# Patient Record
Sex: Male | Born: 1975 | Race: White | Hispanic: No | Marital: Single | Smoking: Former smoker
Health system: Southern US, Community
[De-identification: ages and names within clinical notes are randomized; demographics above are authoritative.]

## PROBLEM LIST (undated history)

## (undated) DIAGNOSIS — J45909 Unspecified asthma, uncomplicated: Secondary | ICD-10-CM

## (undated) DIAGNOSIS — F419 Anxiety disorder, unspecified: Secondary | ICD-10-CM

## (undated) HISTORY — PX: INGUINAL HERNIA REPAIR: SUR1180

## (undated) HISTORY — DX: Anxiety disorder, unspecified: F41.9

## (undated) HISTORY — PX: BACK SURGERY: SHX140

## (undated) HISTORY — DX: Unspecified asthma, uncomplicated: J45.909

---

## 2006-09-18 ENCOUNTER — Encounter: Payer: Self-pay | Admitting: Internal Medicine

## 2006-10-05 ENCOUNTER — Encounter: Payer: Self-pay | Admitting: Internal Medicine

## 2007-03-07 ENCOUNTER — Emergency Department: Payer: Self-pay | Admitting: Emergency Medicine

## 2007-08-30 ENCOUNTER — Ambulatory Visit: Payer: Self-pay | Admitting: Urology

## 2007-11-19 ENCOUNTER — Ambulatory Visit: Payer: Self-pay | Admitting: Internal Medicine

## 2007-12-05 ENCOUNTER — Ambulatory Visit: Payer: Self-pay | Admitting: Family Medicine

## 2007-12-09 ENCOUNTER — Ambulatory Visit: Payer: Self-pay | Admitting: Orthopedic Surgery

## 2007-12-15 ENCOUNTER — Ambulatory Visit: Payer: Self-pay | Admitting: Pain Medicine

## 2007-12-22 ENCOUNTER — Encounter: Payer: Self-pay | Admitting: Physician Assistant

## 2007-12-27 ENCOUNTER — Ambulatory Visit: Payer: Self-pay | Admitting: Physician Assistant

## 2007-12-28 ENCOUNTER — Emergency Department: Payer: Self-pay | Admitting: Internal Medicine

## 2008-01-04 ENCOUNTER — Encounter: Payer: Self-pay | Admitting: Physician Assistant

## 2008-02-21 ENCOUNTER — Ambulatory Visit: Payer: Self-pay | Admitting: Pain Medicine

## 2008-02-29 ENCOUNTER — Ambulatory Visit: Payer: Self-pay | Admitting: Pain Medicine

## 2008-03-16 ENCOUNTER — Ambulatory Visit: Payer: Self-pay | Admitting: Physician Assistant

## 2008-05-31 ENCOUNTER — Ambulatory Visit: Payer: Self-pay | Admitting: Pain Medicine

## 2008-10-18 ENCOUNTER — Ambulatory Visit: Payer: Self-pay | Admitting: General Practice

## 2008-10-31 ENCOUNTER — Ambulatory Visit: Payer: Self-pay | Admitting: Urology

## 2008-11-07 ENCOUNTER — Ambulatory Visit: Payer: Self-pay | Admitting: Urology

## 2008-12-08 ENCOUNTER — Ambulatory Visit: Payer: Self-pay | Admitting: General Practice

## 2009-03-20 ENCOUNTER — Ambulatory Visit: Payer: Self-pay

## 2009-05-05 ENCOUNTER — Emergency Department: Payer: Self-pay | Admitting: Emergency Medicine

## 2009-06-14 ENCOUNTER — Ambulatory Visit: Payer: Self-pay | Admitting: Unknown Physician Specialty

## 2009-07-03 ENCOUNTER — Ambulatory Visit: Payer: Self-pay | Admitting: Unknown Physician Specialty

## 2009-07-12 ENCOUNTER — Ambulatory Visit: Payer: Self-pay

## 2009-09-05 ENCOUNTER — Ambulatory Visit: Payer: Self-pay

## 2011-01-09 IMAGING — CT CT STONE STUDY
1 of 2 series · 15 of 32 positions shown, 19 images · non-contrast
Comparison: none

REASON FOR EXAM: call report [id number] for Tallison Spielmann pain
hematuria increased flank pain
COMMENTS:

[Series 2: stone · axial · 0.75mm/px · z∈[-528,-110]mm · 15 of 153 slices shown, 19 images]
[im 7/153  soft-tissue]
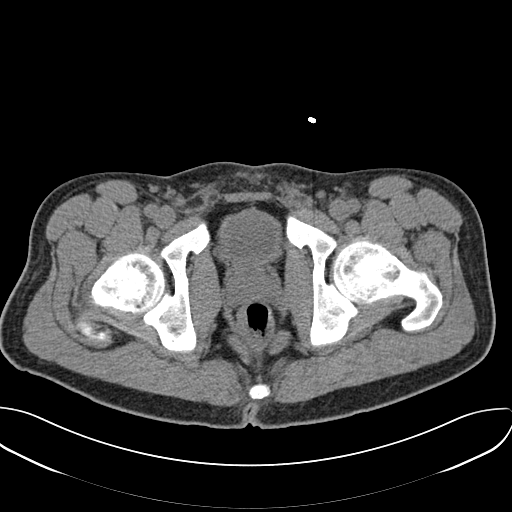
[im 7/153  bone]
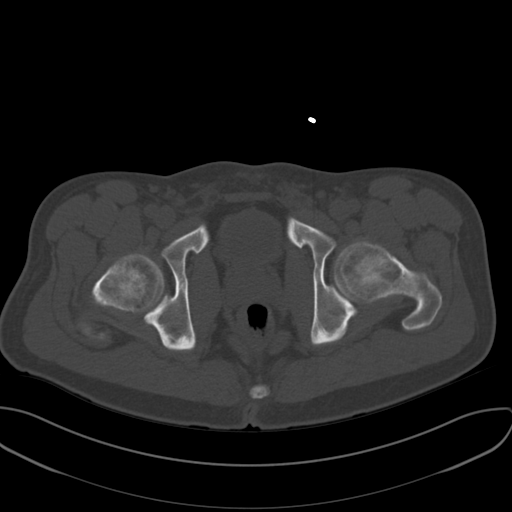
[im 19/153  soft-tissue]
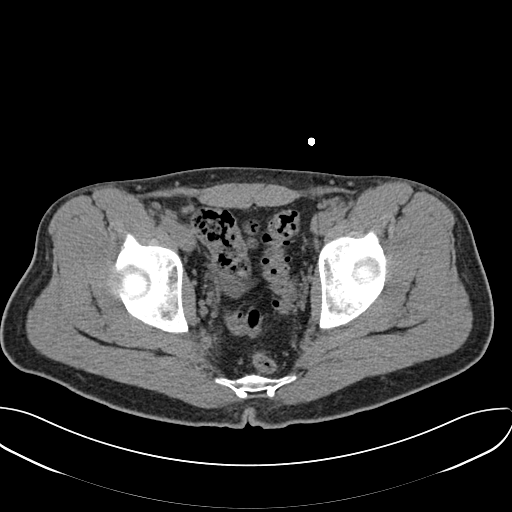
[im 31/153  soft-tissue]
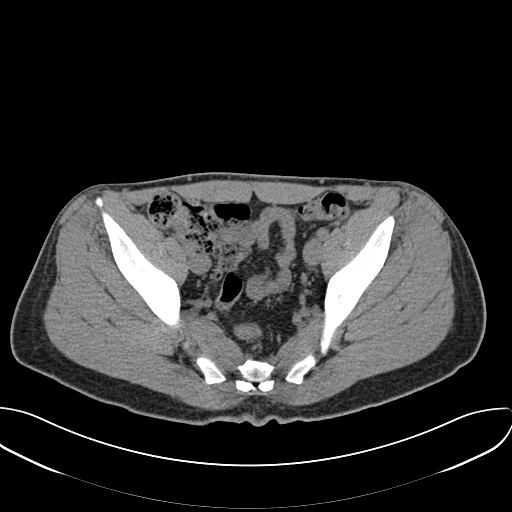
[im 43/153  soft-tissue]
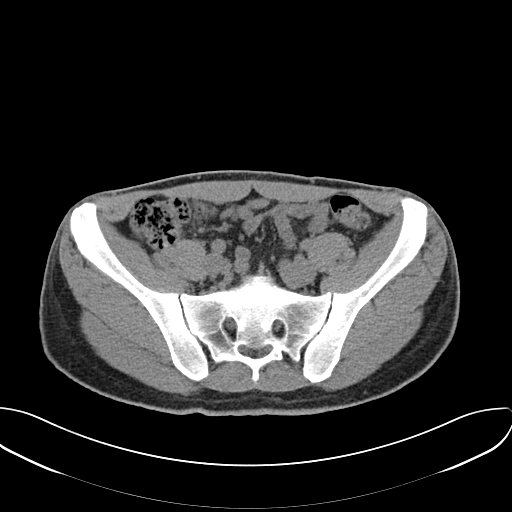
[im 55/153  soft-tissue]
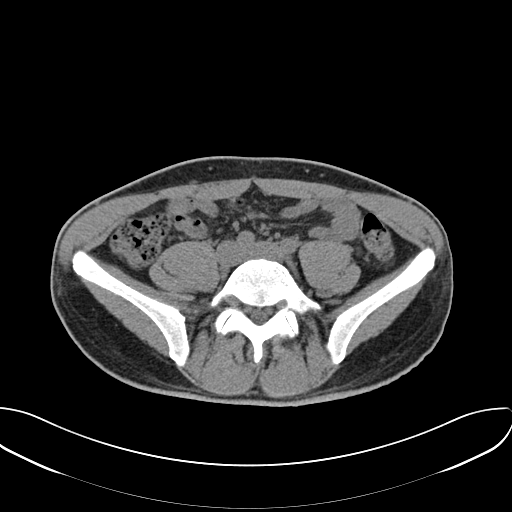
[im 67/153  soft-tissue]
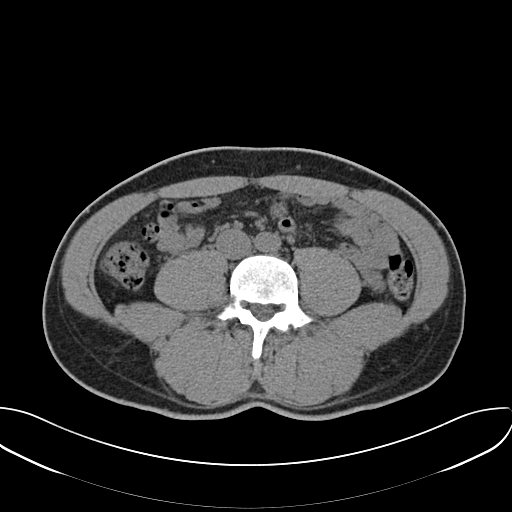
[im 80/153  soft-tissue]
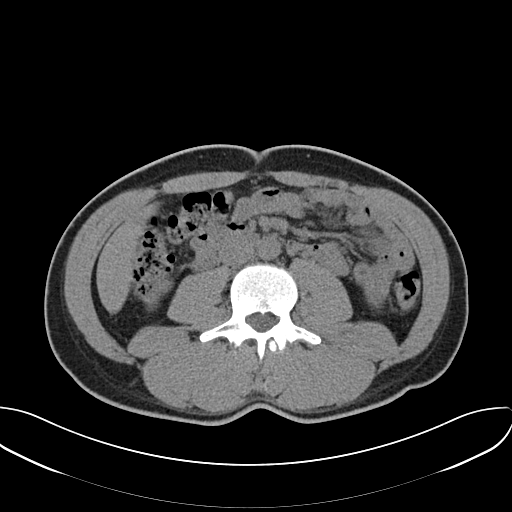
[im 86/153  soft-tissue]
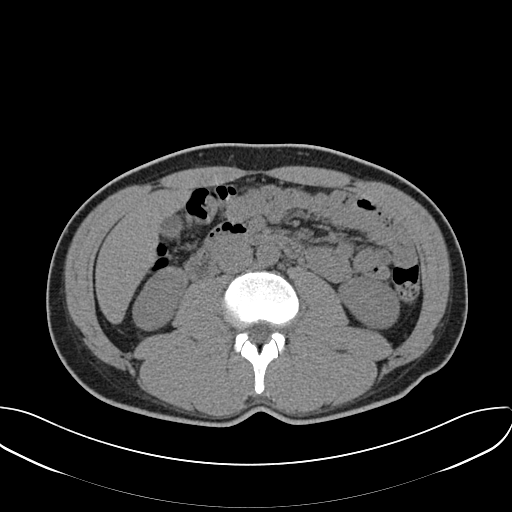
[im 98/153  soft-tissue]
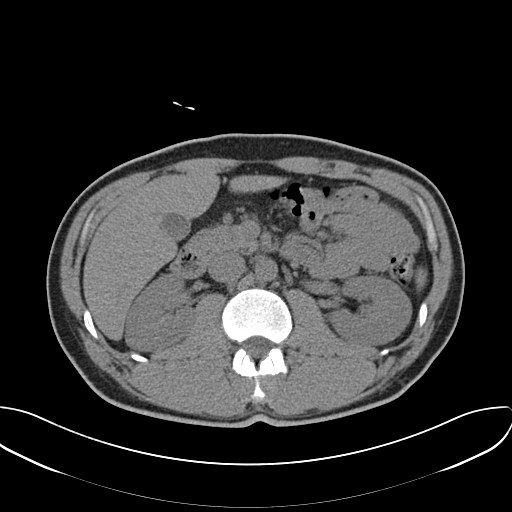
[im 98/153  bone]
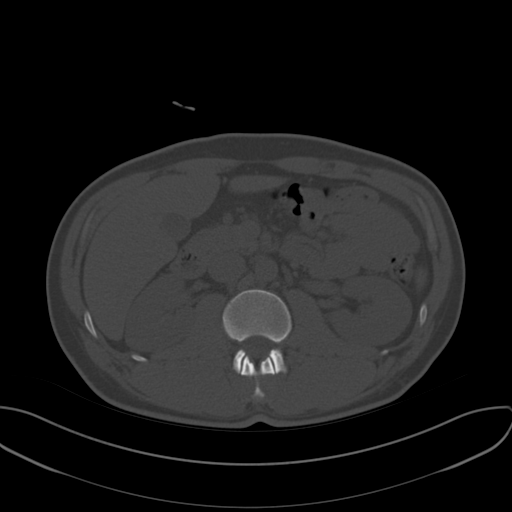
[im 110/153  soft-tissue]
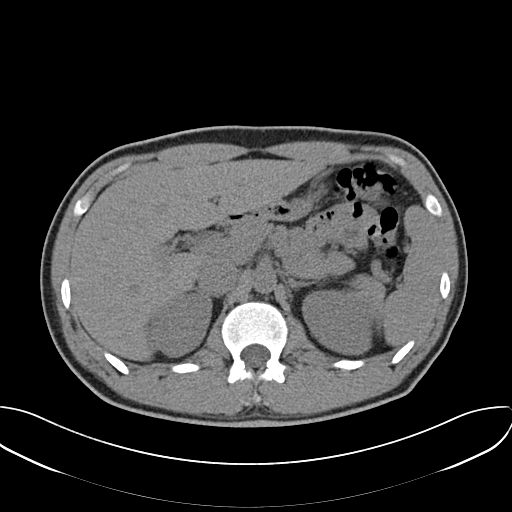
[im 122/153  soft-tissue]
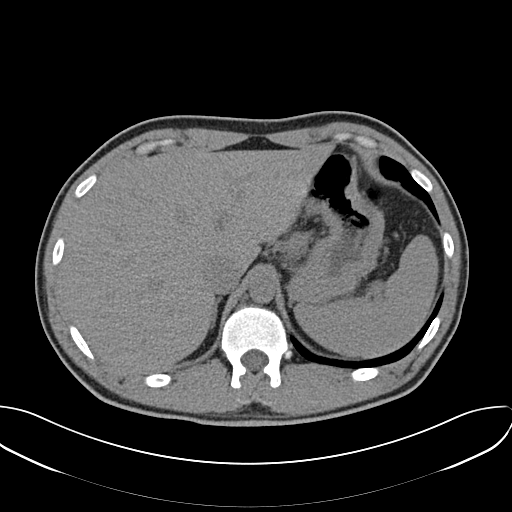
[im 128/153  lung]
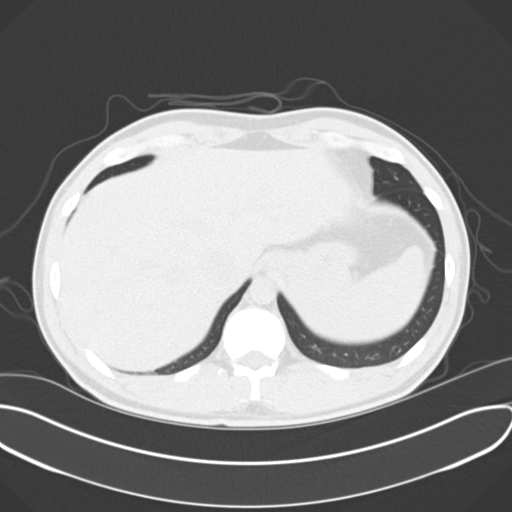
[im 134/153  soft-tissue]
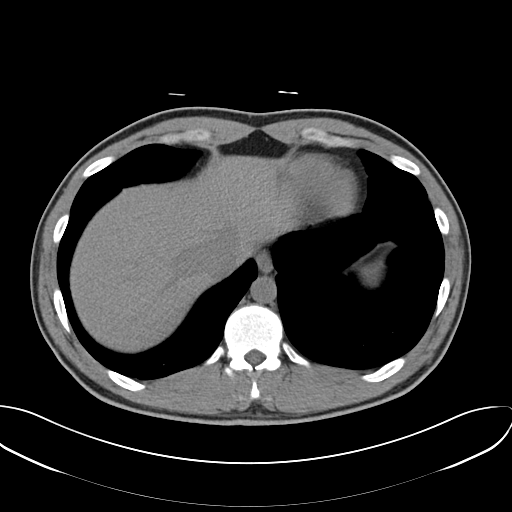
[im 134/153  lung]
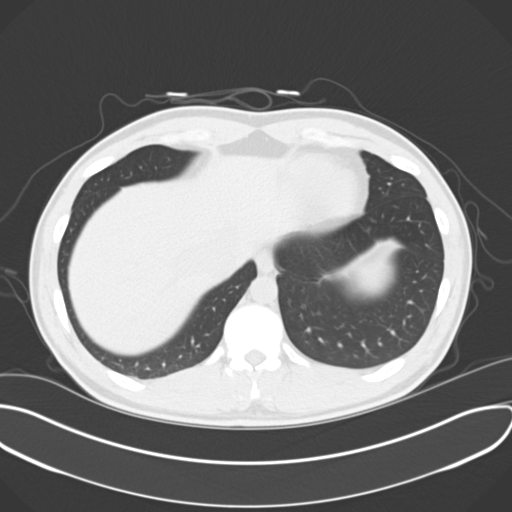
[im 140/153  lung]
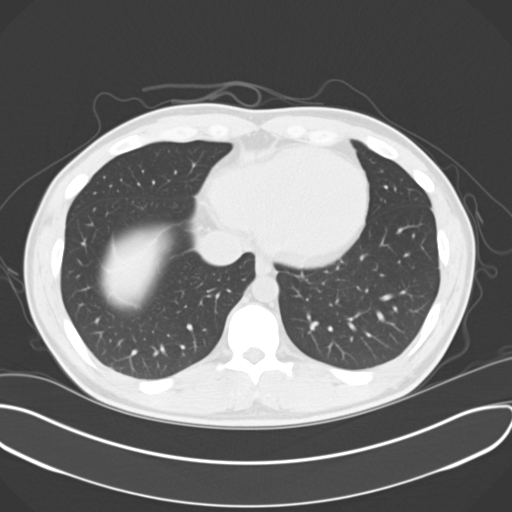
[im 146/153  soft-tissue]
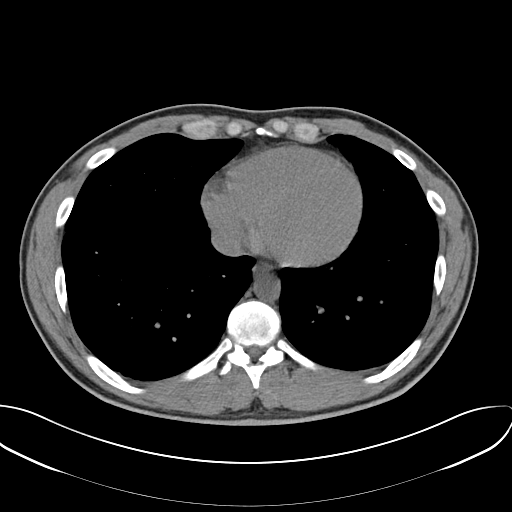
[im 146/153  lung]
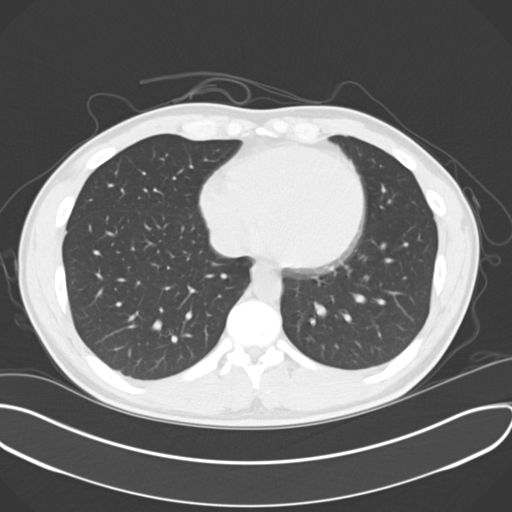

[15 of 32 positions shown; findings below may reference images not displayed]

PROCEDURE:     CT  - CT ABDOMEN /PELVIS WO (STONE)  - October 18, 2008  [DATE]

RESULT:     Axial noncontrast CT scanning was performed through the abdomen
and pelvis at 3 mm intervals and slice thicknesses. Review of 3-dimensional
reconstructed images was performed separately on the [employer] monitor.

The kidneys exhibit normal contour and density. Submillimeter calcification
is seen in the medullary portion of both kidneys. In the lower pole of the
left kidney there is nonobstructive 2 mm diameter stone. There may be a
submillimeter stone in a mid pole calyx on the right. The perinephric fat
exhibits normal density. The partially distended urinary bladder is normal
in appearance. I see no evidence of abnormal calcifications along the
expected course of the ureters.

The liver, spleen, gallbladder, adrenal glands, nondistended stomach, and
pancreas exhibit no acute abnormality. The unopacified loops of small and
large bowel are normal in appearance. There is no free fluid in the pelvis.
The partially distended urinary bladder is normal in appearance. There is a
structure demonstrated consistent with a normal appendix.
IMPRESSION: 1. There are calcifications within the renal collecting systems. Most
measure less than 1 mm in diameter but a 2 mm diameter lower pole stone on
the left is present. I see no hydronephrosis.
2. I do not see acute abnormality elsewhere within the abdomen or pelvis.

This report was called to the Angi Billiot, PA at the conclusion of the
study.

## 2011-04-07 IMAGING — CR DG ABDOMEN 1V
1 series · 3 of 3 positions shown · non-contrast
Comparison: none

REASON FOR EXAM: kidney stone   send films with pt
COMMENTS:

[Series 1: view not recorded · 0.17mm/px · 3 of 3 slices shown]
[im 1/3]
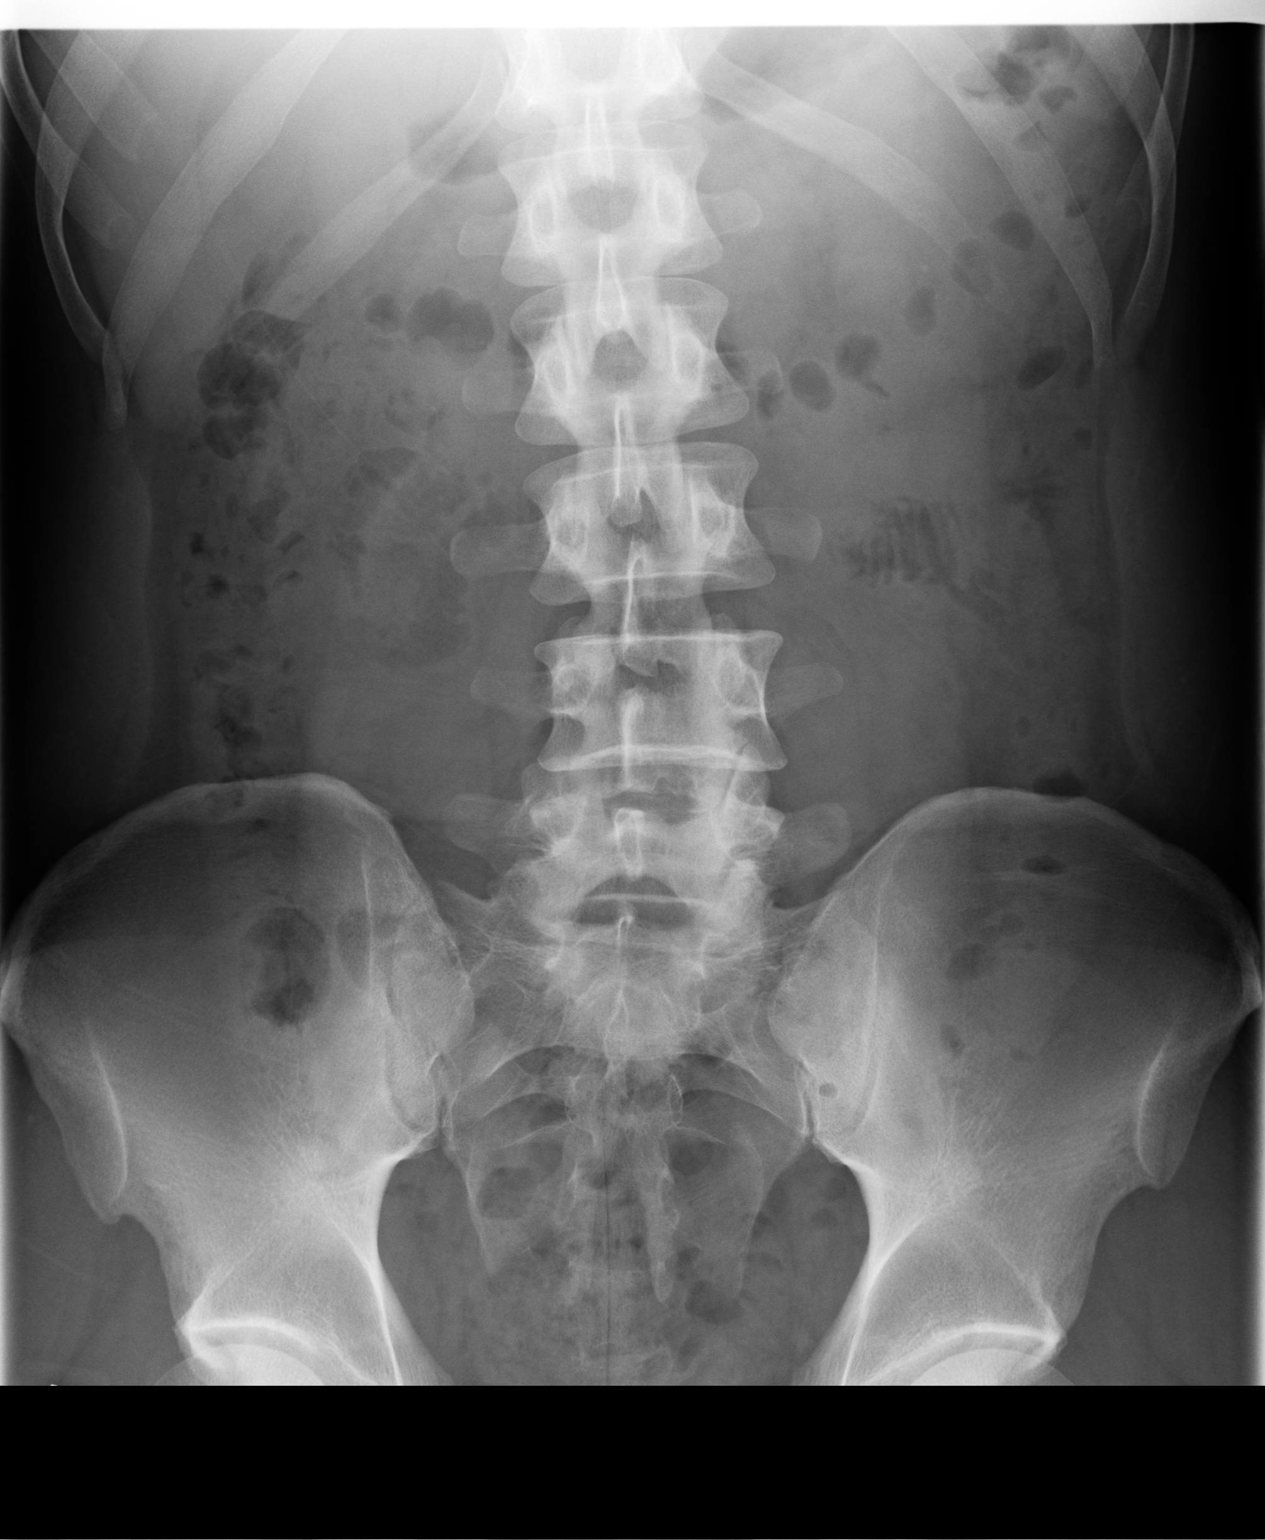
[im 2/3]
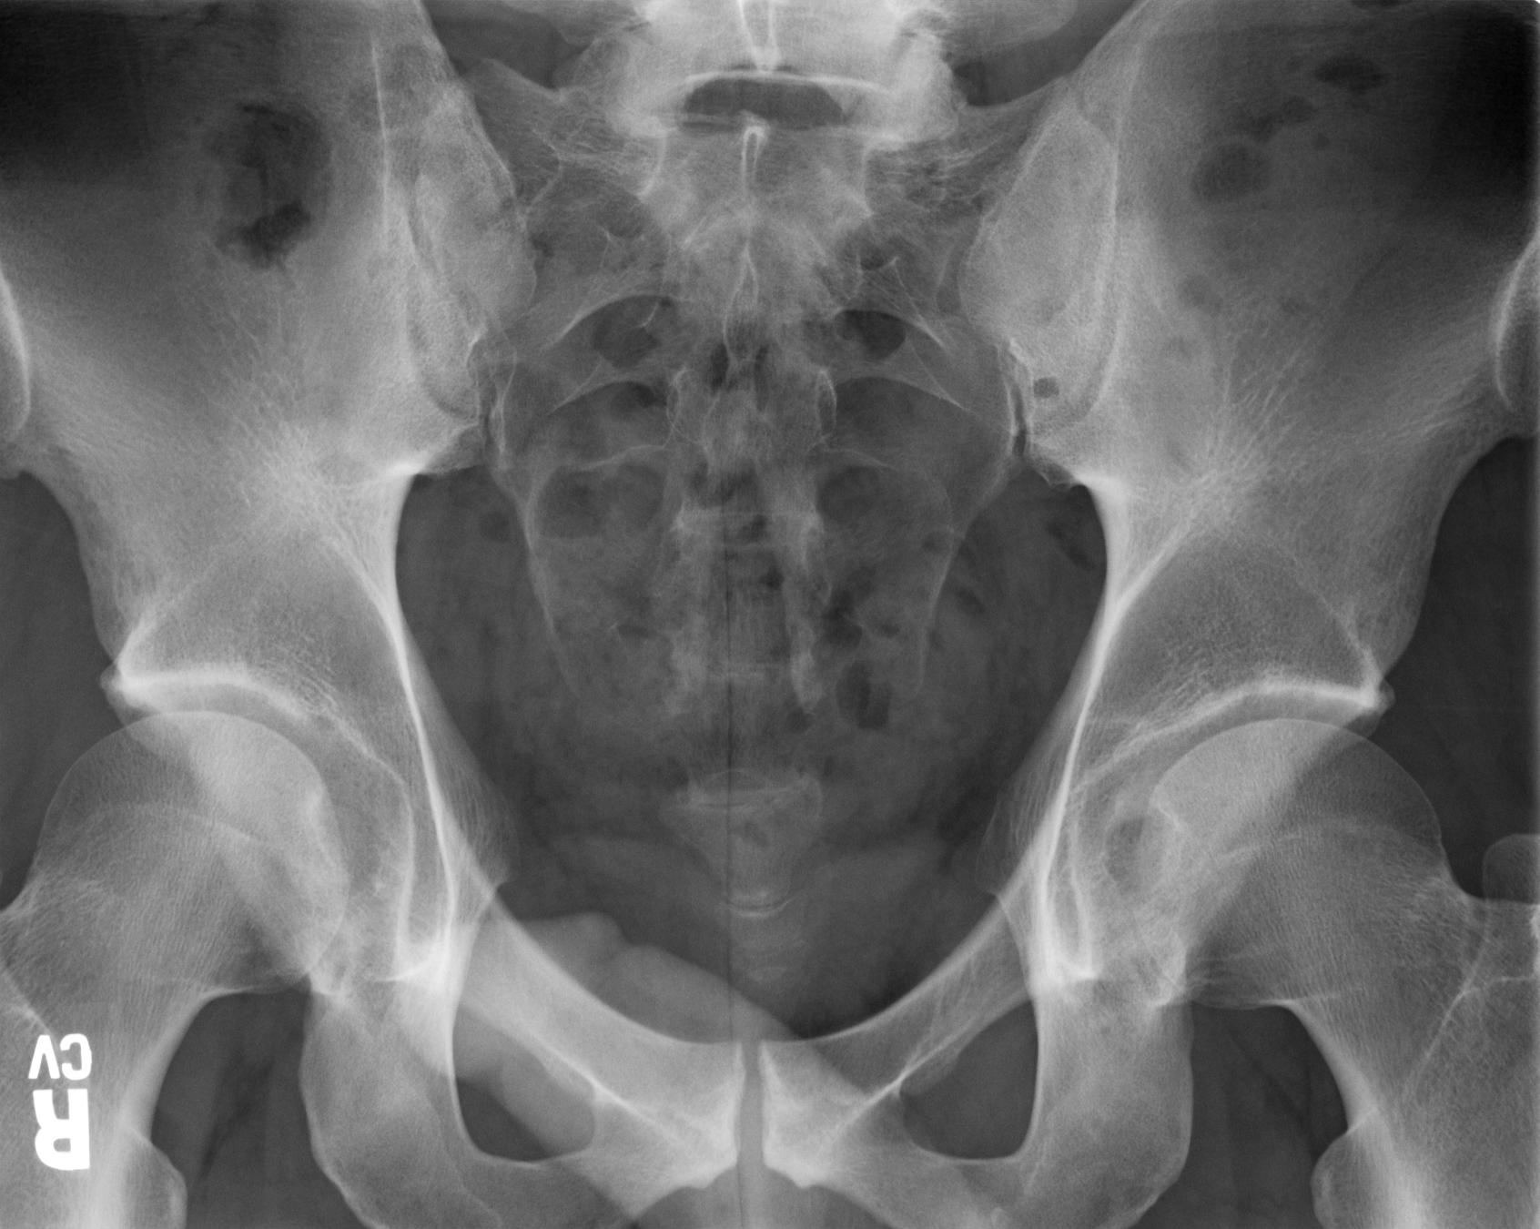
[im 3/3]
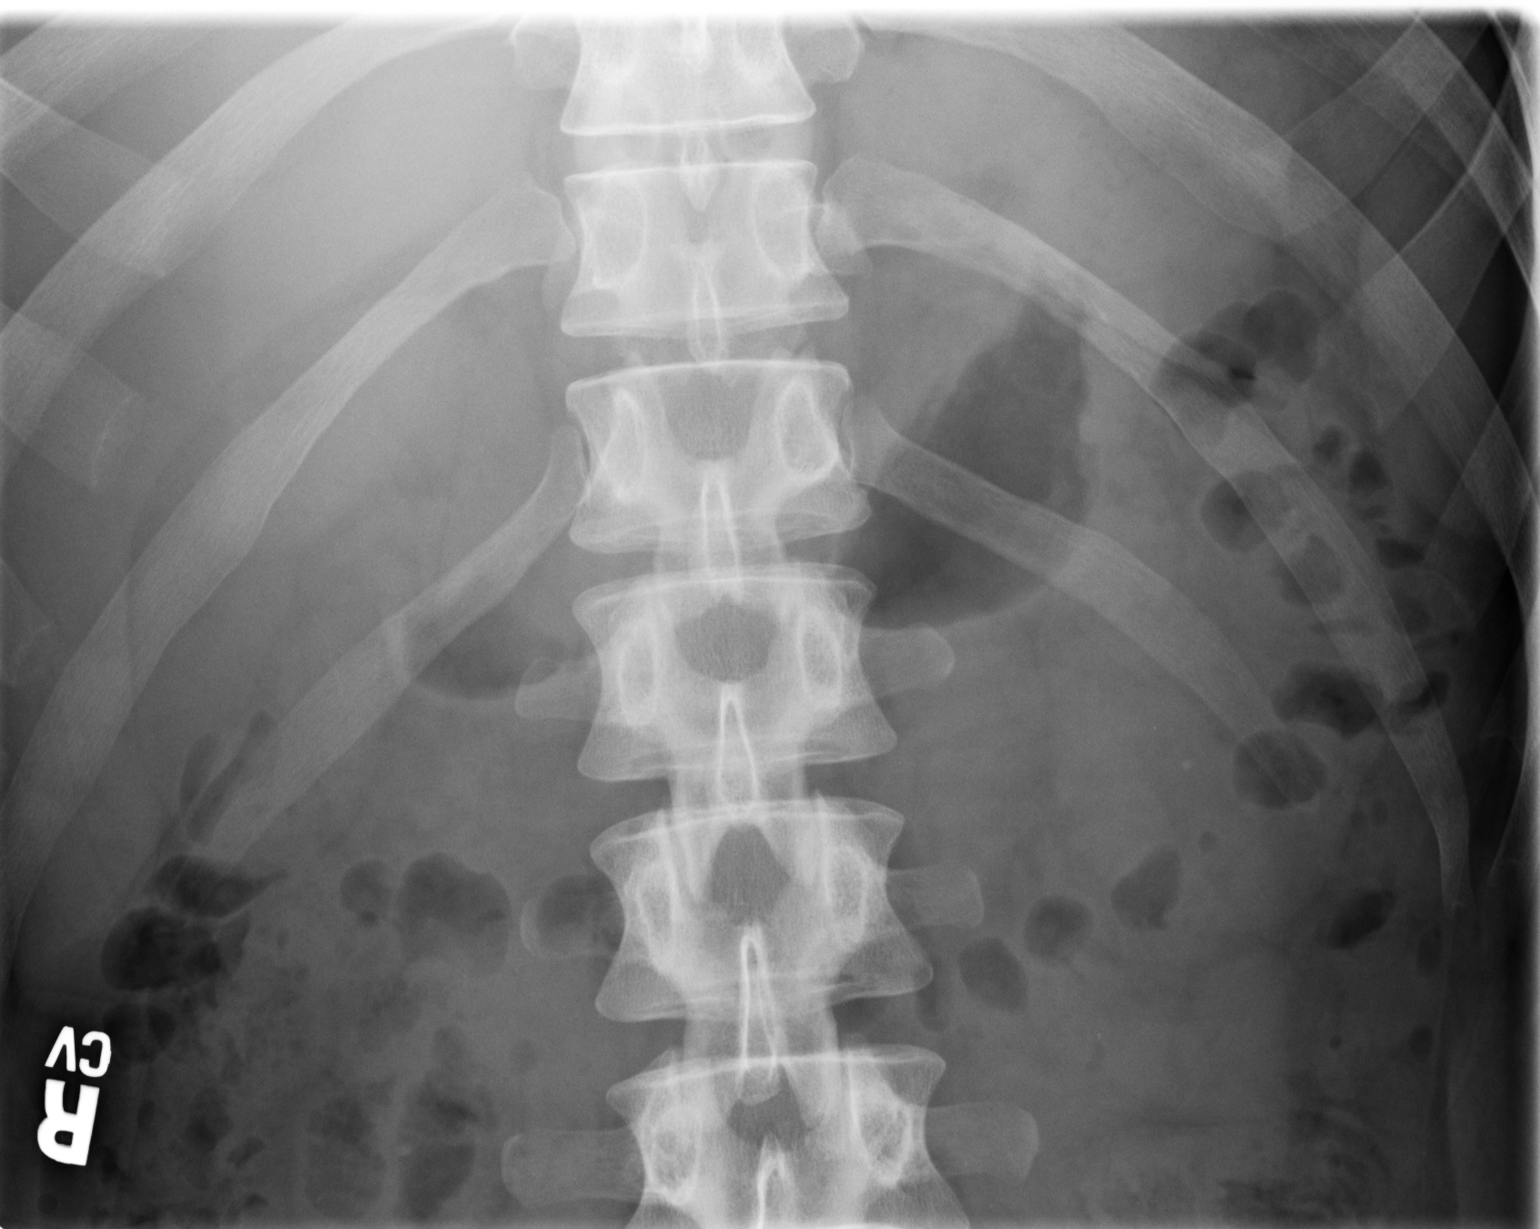

[3 of 3 positions shown; findings below may reference images not displayed]

PROCEDURE:     MDR - MDR KIDNEY URETER BLADDER  - November 07, 2008  [DATE]

RESULT:     Comparison is made to study 31 October, 2008.

The bowel gas pattern is within the limits of normal. I do not see definite
abnormal calcifications projecting over the right kidney. A faint calcific
density on the left may be associated with the left kidney. It lies just
inferior to the twelfth rib. The bony structures appear normal.
IMPRESSION: A tiny calcification may be associated with the lower pole
of the left kidney. Otherwise I do not see findings suspicious for calcified
stones.

## 2011-05-08 IMAGING — US US PELVIS LIMITED
1 series · 17 of 25 positions shown · non-contrast
Comparison: none

REASON FOR EXAM: R inguinal pain/hernia  scrotal swelling
COMMENTS:

[Series 1: us pelvis limited · 17 of 68 slices shown]
[im 1/68]
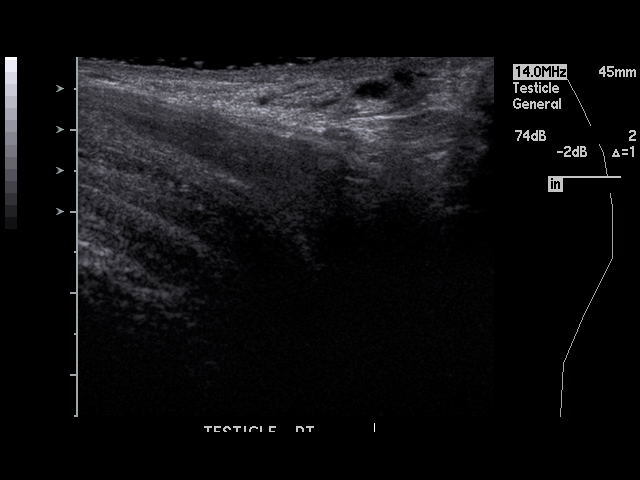
[im 6/68]
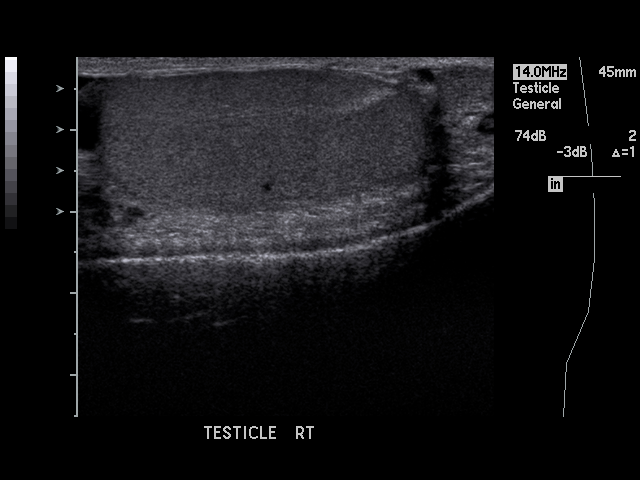
[im 9/68]
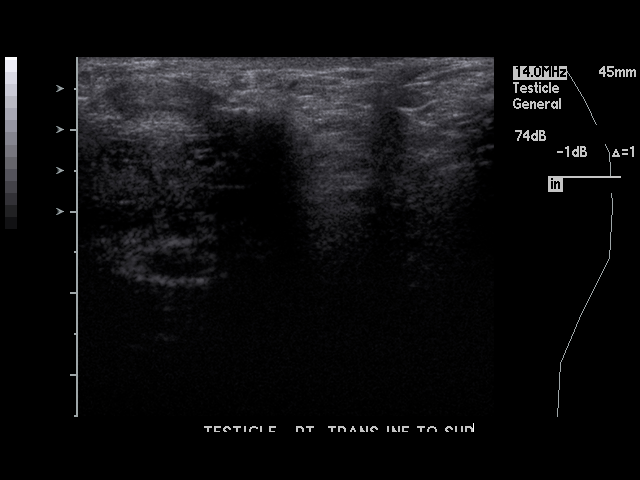
[im 14/68]
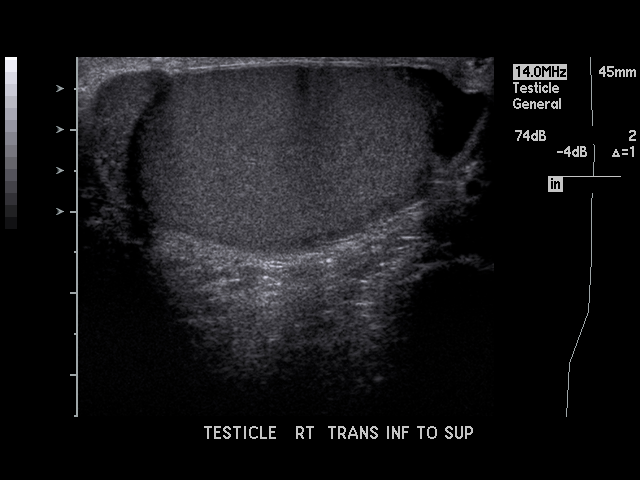
[im 17/68]
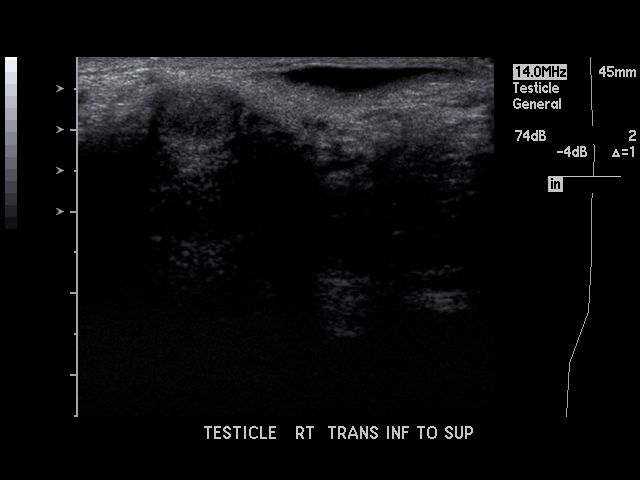
[im 23/68]
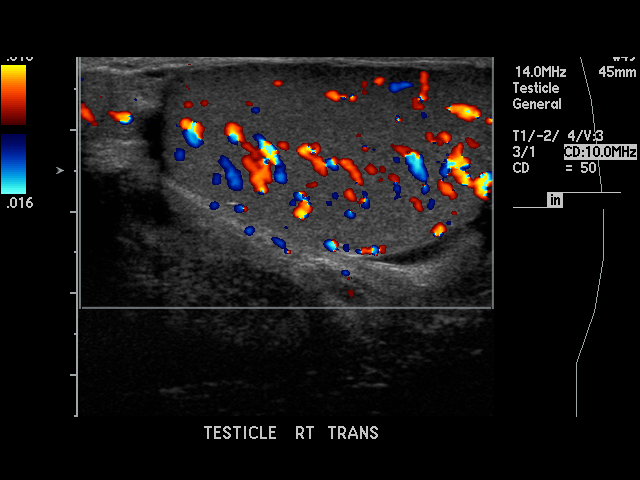
[im 26/68]
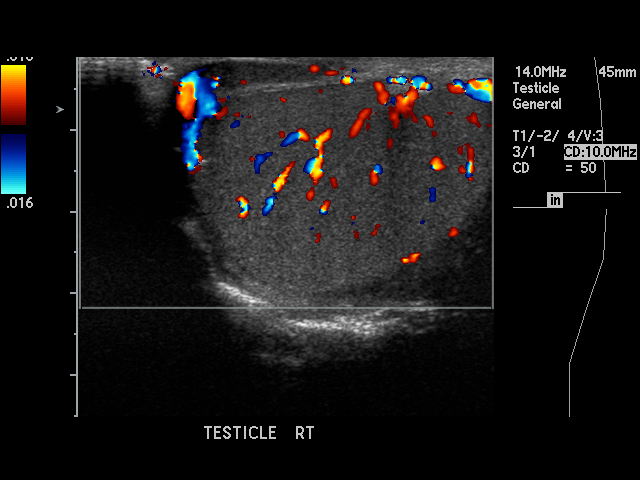
[im 31/68]
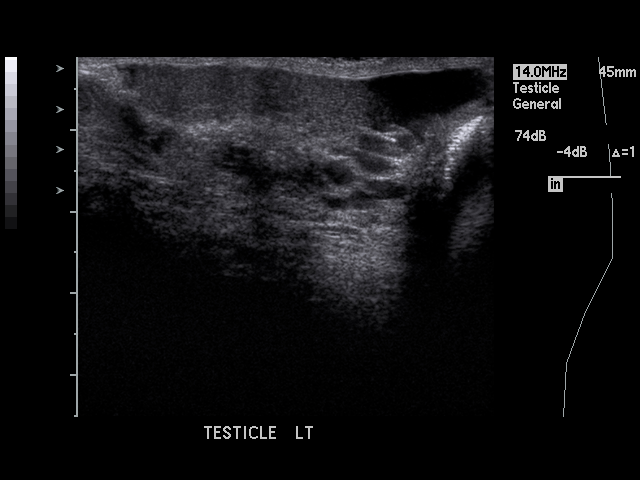
[im 34/68]
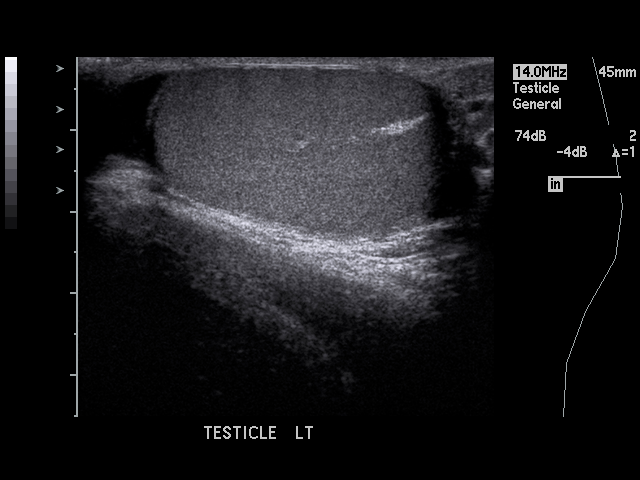
[im 37/68]
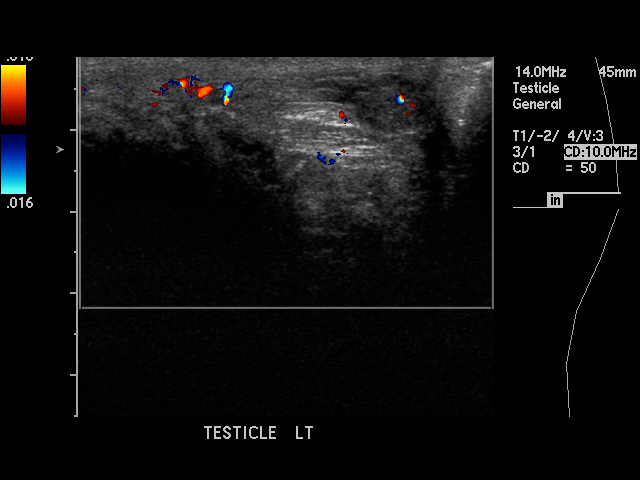
[im 42/68]
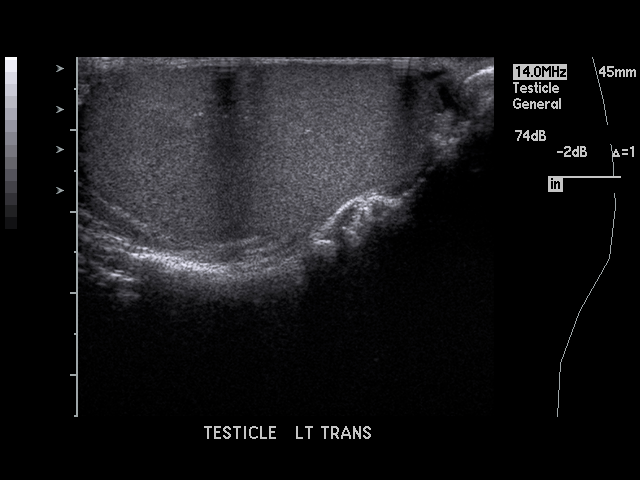
[im 45/68]
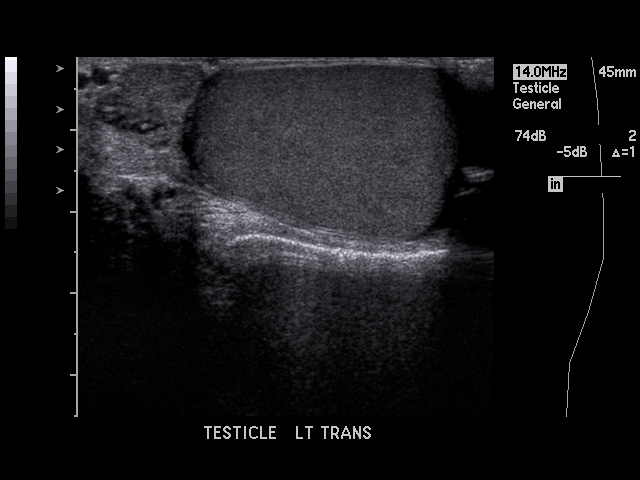
[im 51/68]
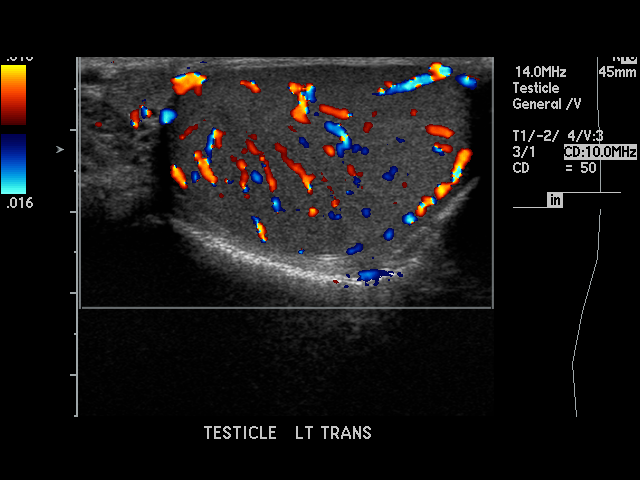
[im 54/68]
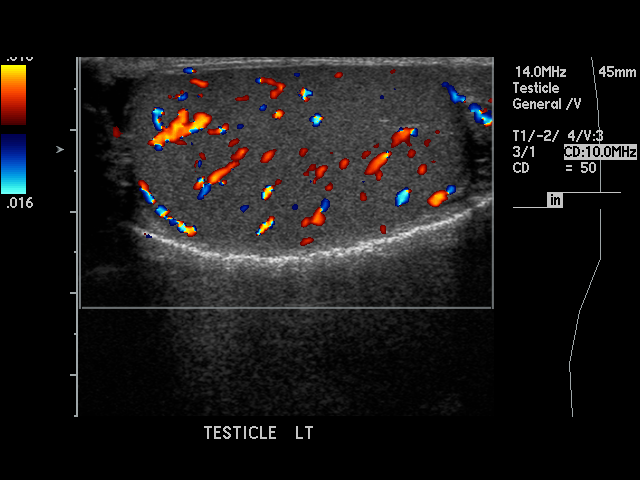
[im 59/68]
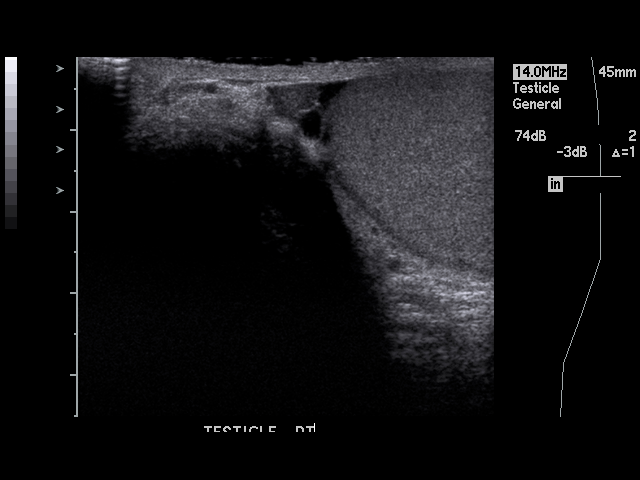
[im 62/68]
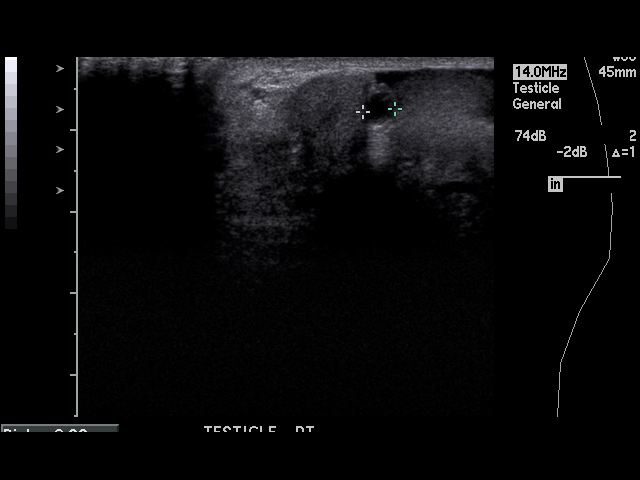
[im 68/68]
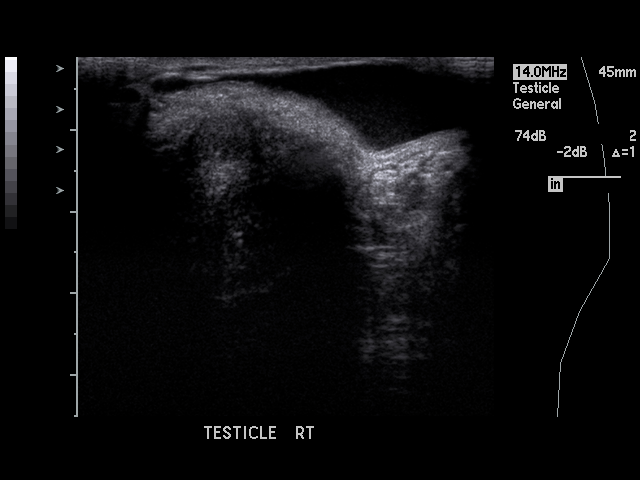

[17 of 25 positions shown; findings below may reference images not displayed]

PROCEDURE:     US  - US TESTICULAR  - December 08, 2008  [DATE]

RESULT:     The right testicle measures 4.8 cm x 2.3 cm x 4.4 cm and the
left testicle measures 4.2 cm x 2.5 cm x 3.4 cm. No intratesticular mass
lesions are seen. Incidental note is made of a 3.9 cm epididymal cyst on the
right. Doppler examination shows symmetrical vascular flow in each testicle.
There is no evidence for torsion. There is a small hydrocele on the right.
No hernia into the scrotal sac is demonstrated.
IMPRESSION: 1. No significant abnormalities are identified.
2. No evidence of torsion is seen.
3. No hernia extending into the scrotum is seen.
4. Incidental note is made of a small right epididymal cyst.
5. There is a small right hydrocele.

## 2011-10-17 ENCOUNTER — Ambulatory Visit: Payer: Self-pay | Admitting: Internal Medicine

## 2012-09-05 ENCOUNTER — Emergency Department: Payer: Self-pay | Admitting: Emergency Medicine

## 2013-06-28 ENCOUNTER — Ambulatory Visit: Payer: Self-pay | Admitting: Surgery

## 2013-06-28 LAB — CBC
HCT: 40.4 % (ref 40.0–52.0)
HGB: 13.6 g/dL (ref 13.0–18.0)
MCH: 29.9 pg (ref 26.0–34.0)
MCHC: 33.6 g/dL (ref 32.0–36.0)
MCV: 89 fL (ref 80–100)
PLATELETS: 275 10*3/uL (ref 150–440)
RBC: 4.54 10*6/uL (ref 4.40–5.90)
RDW: 13 % (ref 11.5–14.5)
WBC: 7.5 10*3/uL (ref 3.8–10.6)

## 2013-06-28 LAB — BASIC METABOLIC PANEL
Anion Gap: 4 — ABNORMAL LOW (ref 7–16)
BUN: 14 mg/dL (ref 7–18)
Calcium, Total: 8.9 mg/dL (ref 8.5–10.1)
Chloride: 104 mmol/L (ref 98–107)
Co2: 31 mmol/L (ref 21–32)
Creatinine: 1.06 mg/dL (ref 0.60–1.30)
Glucose: 101 mg/dL — ABNORMAL HIGH (ref 65–99)
Osmolality: 278 (ref 275–301)
Potassium: 3.5 mmol/L (ref 3.5–5.1)
Sodium: 139 mmol/L (ref 136–145)

## 2013-07-22 ENCOUNTER — Ambulatory Visit: Payer: Self-pay | Admitting: Surgery

## 2014-05-27 NOTE — Op Note (Signed)
PATIENT NAME:  Neil Vargas, Neil Vargas#:  [id number] DATE OF BIRTH:  May 14, 1975  DATE OF PROCEDURE:  07/22/2013  PREOPERATIVE DIAGNOSIS: Right inguinal hernia.   POSTOPERATIVE DIAGNOSIS: Right inguinal hernia.   PROCEDURE: Right inguinal hernia repair.   SURGEON: Adella HareJ. Wilton Smith, MD  INDICATION: This 39 year old male reports Vargas history of bulging in the right groin, and Vargas right inguinal hernia was demonstrated on physical exam and repair recommended for definitive treatment.   DESCRIPTION OF PROCEDURE: The patient was placed on the operating table in the supine position under general anesthesia. The abdomen was clipped and prepared with ChloraPrep and draped in Vargas sterile manner.   Vargas right lower quadrant transversely oriented suprapubic incision was made, carried down through subcutaneous tissues. One traversing vein was divided between 4-0 chromic suture ligatures. Dissection was carried down to incise the Scarpa's fascia and identify the external ring. The external oblique aponeurosis was incised along the course of its fibers to open the external ring and expose the inguinal cord structures. The cord structures were then mobilized with blunt dissection. The cut edges of the external oblique aponeurosis were retracted with the Weitlaner retractor. The cremaster fibers were spread to expose an indirect hernia sac. The sac was dissected free from surrounding structures. The cord structures were retracted with Vargas Penrose drain. The sac was followed up to the internal ring. There was Vargas separate cord lipoma which was individually suture-ligated with 4-0 Vicryl and amputated and did not need to be sent for pathology. The hernia sac itself was opened. It did contain some properitoneal fat, and its continuity with the peritoneal cavity was demonstrated. Vargas high ligation of the hernia sac was done with Vargas 0 Surgilon pursestring suture and was doubly ligated and amputated, and I elected not to send this for pathology  as it appeared to be benign tissue. Next, the stump was allowed to retract. The floor of the inguinal canal was repaired with Vargas row of 0 Surgilon sutures beginning at the pubic tubercle, suturing the conjoined tendon to the Cooper's ligament with transition stitch onto the shelving edge of the inguinal ligament, incorporating transversalis fascia into the repair. The last stitch led to satisfactory narrowing of the internal ring. Next, Vargas relaxing incision was made medially which was approximately 2 cm in length. Next, Vargas Bard soft mesh was cut to create oval-shaped mesh which was approximately 2.8 x 3.5 cm. Vargas notch was cut out to straddle the internal ring. The mesh was sutured to the repair and also was sutured to the medial edge of the relaxing incision and was then sutured on both sides of the internal ring. The repair looked good. Hemostasis was intact. The cut edges of the external oblique aponeurosis were closed with running 4-0 Vicryl. The deep fascia superior and lateral to the repair site was infiltrated with 10 mL of 0.5% Sensorcaine with epinephrine. Subcutaneous tissues were infiltrated with 5 mL. The Scarpa's fascia was closed with interrupted 4-0 Vicryl. The skin was closed with running 4-0 Monocryl subcuticular suture and Dermabond. The patient tolerated surgery satisfactorily and was then prepared for transfer to the recovery room.   ____________________________ Shela CommonsJ. Renda RollsWilton Smith, MD jws:lb D: 07/22/2013 09:22:22 ET T: 07/22/2013 09:39:00 ET JOB#: [id number]  cc: Adella HareJ. Wilton Smith, MD, <Dictator> Adella HareWILTON J SMITH MD ELECTRONICALLY SIGNED 07/22/2013 19:21

## 2021-09-10 ENCOUNTER — Ambulatory Visit: Payer: Self-pay | Admitting: Family Medicine

## 2022-03-18 ENCOUNTER — Other Ambulatory Visit: Payer: Self-pay | Admitting: Internal Medicine

## 2022-03-18 DIAGNOSIS — Z981 Arthrodesis status: Secondary | ICD-10-CM

## 2022-03-18 DIAGNOSIS — M5416 Radiculopathy, lumbar region: Secondary | ICD-10-CM

## 2022-07-01 ENCOUNTER — Other Ambulatory Visit: Payer: Self-pay | Admitting: Internal Medicine

## 2022-07-01 DIAGNOSIS — M5416 Radiculopathy, lumbar region: Secondary | ICD-10-CM

## 2022-07-10 ENCOUNTER — Ambulatory Visit
Admission: RE | Admit: 2022-07-10 | Discharge: 2022-07-10 | Disposition: A | Discharge status: Home / Self Care | Payer: BC Managed Care – PPO | Source: Ambulatory Visit | Attending: Internal Medicine | Admitting: Internal Medicine

## 2022-07-10 ENCOUNTER — Other Ambulatory Visit: Payer: BC Managed Care – PPO

## 2022-07-10 DIAGNOSIS — M5416 Radiculopathy, lumbar region: Secondary | ICD-10-CM

## 2022-07-10 MED ORDER — GADOPICLENOL 0.5 MMOL/ML IV SOLN
10.0000 mL | Freq: Once | INTRAVENOUS | Status: AC | PRN
  Administered 2022-07-10: 10 mL via INTRAVENOUS

## 2022-08-01 NOTE — Progress Notes (Addendum)
Referring Physician:  Danella Penton, MD [address] [facility] [place],  [place] [address]  Primary Physician:  Neil Penton, MD  History of Present Illness: 08/05/22  [name] is a 47 year old male with a history of 2 spinal surgeries 1 in 2005 and 1 in 2012 who is here today with a chief complaint of ongoing lumbosacral complaints. He states he initially had left sided pain prior to his last surgery which resolved. Over the last 5 years he has had intermittent sciatic type pain that radiates into his right buttock and down the lateral aspect of his right leg into the anterior lateral aspect of his right shin.  This is worse with standing for prolonged periods of time and does improve some with rest.  He has persistent numbness in the distal aspect of his right lower extremity.  He continues to deny left-sided symptoms. In addition to the symptoms, he endorses intermittent right groin pain that is worse with bending his right leg and intermittent shooting pain into his testicles.  This is less severe and less persistent than his lumbosacral complaints. He recently saw his PCP, Neil Vargas who increased his gabapentin to 600 mg at night.   Conservative measures:  Physical therapy: He has not participated in any recent physical therapy targeting his lumbar spine Multimodal medical therapy including regular antiinflammatories:  gabapentin, meloxicam, prednisone, tizanidine Injections:  has not received epidural steroid injections in his lumbar spine  Past Surgery:  Lumbar Surgery in 2005 and 2012  Neil Vargas has no symptoms of cervical myelopathy.  The symptoms are causing a significant impact on the patient's life.   Review of Systems:  A 10 point review of systems is negative, except for the pertinent positives and negatives detailed in the HPI.  Past Medical History: No past medical history on file.  Past Surgical  History:  Allergies: Allergies as of 08/05/2022   (Not on File)    Medications: No outpatient encounter medications on file as of 08/05/2022.   No facility-administered encounter medications on file as of 08/05/2022.    Social History:    Family Medical History: No family history on file.  Physical Examination: Today's Vitals   08/05/22 0916  BP: 129/76  Pulse: 75  Temp: 98.1 F (36.7 C)  TempSrc: Oral  Weight: 98.5 kg   There is no height or weight on file to calculate BMI.   General: Patient is well developed, well nourished, calm, collected, and in no apparent distress. Attention to examination is appropriate.  Psychiatric: Patient is non-anxious.  Head:  Pupils equal, round, and reactive to light.  ENT:  Oral mucosa appears well hydrated.  Neck:   Supple.  Full range of motion.  Respiratory: Patient is breathing without any difficulty.  Extremities: No edema.  Vascular: Palpable dorsal pedal pulses.  Skin:   On exposed skin, there are no abnormal skin lesions.  NEUROLOGICAL:     Awake, alert, oriented to person, place, and time.  Speech is clear and fluent. Fund of knowledge is appropriate.   Cranial Nerves: Pupils equal round and reactive to light.  Facial tone is symmetric.  Facial sensation is symmetric.  ROM of spine: full.  Palpation of spine: non tender.    Strength: Side Biceps Triceps Deltoid Interossei Grip Wrist Ext. Wrist Flex.  R 5 5 5 5 5 5 5   L 5 5 5 5 5 5 5    Side Iliopsoas Quads Hamstring PF DF  EHL  R 5 5 5 5 5 5   L 5 5 5 5 5 5    Reflexes are 1+ and symmetric at the biceps, triceps, brachioradialis, patella and achilles.   Hoffman's is absent.  Clonus is not present.  Toes are down-going.  Bilateral upper and lower extremity sensation is intact to light touch except decreased sensation to light touch of distal right leg Gait is normal.    Medical Decision Making  Imaging: MRI L spine 07/10/22 IMPRESSION: 1. Status post  posterior instrumented fusion at L4-L5 without significant spinal canal or neural foraminal stenosis. 2. 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 3. Grade 1 retrolisthesis of L2 on L3 with a mild disc bulge resulting in mild left subarticular zone narrowing.     Electronically Signed   By: Neil Vargas M.D.   On: 07/10/2022 10:23  I have personally reviewed the images and agree with the above interpretation.  Assessment and Plan: [name] is a pleasant 47 y.o. male with a longstanding history of lumbosacral complaints with about 5 years of recurrent and progressively worsening/increased frequency of right radiating leg pain.  He does have adjacent level disease at L3-4 which likely explains the symptoms.  We discussed his treatment options in detail and he would like to avoid surgery as long as possible.  We discussed moving forward with conservative treatment and he was agreeable to attempting physical therapy as well as epidural injections.  I have placed a referral for physical therapy to [facility] and [place] as he works in [place] and passes this location.  I also placed a referral to Dr. Yves Vargas for consideration of ESIs.  In regards to his groin pain, I do believe he may have some underlying hip pathology however this is not the most severe component of his complaint at this time.  Will defer further workup and treatment at this time.  I will see him back in 8-12 weeks to evaluate his progress with conservative treatment options or sooner should he have any new or worsening symptoms.  He expressed understanding and was in agreement with this plan.  Thank you for involving me in the care of this patient.   I spent a total of 45 minutes in both face-to-face and non-face-to-face activities for this visit on the date of  this encounter including review of outside records, review of imaging, discussion of differential diagnosis, discussed treatment options including risks and benefits, physical exam, order placement, and documentation.   [facility]

## 2022-08-05 ENCOUNTER — Ambulatory Visit (INDEPENDENT_AMBULATORY_CARE_PROVIDER_SITE_OTHER): Payer: BC Managed Care – PPO | Admitting: Neurosurgery

## 2022-08-05 ENCOUNTER — Encounter: Payer: Self-pay | Admitting: Neurosurgery

## 2022-08-05 VITALS — BP 129/76 | HR 75 | Temp 98.1°F | Wt 217.2 lb

## 2022-08-05 DIAGNOSIS — M5416 Radiculopathy, lumbar region: Secondary | ICD-10-CM

## 2022-08-05 DIAGNOSIS — M48061 Spinal stenosis, lumbar region without neurogenic claudication: Secondary | ICD-10-CM | POA: Diagnosis not present

## 2022-08-05 DIAGNOSIS — M4316 Spondylolisthesis, lumbar region: Secondary | ICD-10-CM

## 2022-10-07 ENCOUNTER — Ambulatory Visit (INDEPENDENT_AMBULATORY_CARE_PROVIDER_SITE_OTHER): Payer: BC Managed Care – PPO | Admitting: Neurosurgery

## 2022-10-07 DIAGNOSIS — M48061 Spinal stenosis, lumbar region without neurogenic claudication: Secondary | ICD-10-CM

## 2022-10-07 DIAGNOSIS — M5416 Radiculopathy, lumbar region: Secondary | ICD-10-CM

## 2022-10-07 DIAGNOSIS — M48062 Spinal stenosis, lumbar region with neurogenic claudication: Secondary | ICD-10-CM | POA: Diagnosis not present

## 2022-10-07 NOTE — Progress Notes (Signed)
Neurosurgery Telephone (Audio-Only) Note  Requesting Provider     Danella Penton, MD [address] [facility] [address] [facility] [place],  [place] [address] T: [phone] F: [phone]  Primary Care Provider Danella Penton, MD [address] [facility] [facility] [address] [facility] [place] [address] T: [phone] F: [phone]  Telehealth visit was conducted with Neil Vargas, a 47 y.o. male via telephone.   History of Present Illness: Neil Vargas is a very pleasant 47 year old male presenting today via telephone visit to discuss his progress with ESI's and physical therapy.  He states that he has undergone 3-4 sessions of physical therapy with another scheduled for this Friday as well as 1 ESI with Dr. Mariah Milling.  Unfortunately he has not had any significant improvement of his symptoms and feels that things may have even worsened.  He denies any new symptoms.  08/05/22 Neil Vargas is a 47 year old male with a history of 2 spinal surgeries 1 in 2005 and 1 in 2012 who is here today with a chief complaint of ongoing lumbosacral complaints. He states he initially had left sided pain prior to his last surgery which resolved. Over the last 5 years he has had intermittent sciatic type pain that radiates into his right buttock and down the lateral aspect of his right leg into the anterior lateral aspect of his right shin.  This is worse with standing for prolonged periods of time and does improve some with rest.  He has persistent numbness in the distal aspect of his right lower extremity.  He continues to deny left-sided symptoms. In addition to the symptoms, he endorses intermittent right groin pain that is worse with bending his right leg and intermittent shooting pain into his testicles.  This is less severe and less persistent than his lumbosacral complaints. He recently saw his PCP, Bethann Punches who increased his gabapentin to 600 mg at night.     Conservative  measures:  Physical therapy: He has not participated in any recent physical therapy targeting his lumbar spine Multimodal medical therapy including regular antiinflammatories:  gabapentin, meloxicam, prednisone, tizanidine Injections:  has not received epidural steroid injections in his lumbar spine   Past Surgery:  Lumbar Surgery in 2005 and 2012   Neil Vargas has no symptoms of cervical myelopathy.   The symptoms are causing a significant impact on the patient's life.   General Review of Systems:  A ROS was performed including pertinent positive and negatives as documented.  All other systems are negative.    Prior to Admission medications   Medication Sig Start Date End Date Taking? Authorizing Provider  albuterol (VENTOLIN HFA) 108 (90 Base) MCG/ACT inhaler INHALE 1 TO 2 PUFFS EVERY 4 TO 6 HOURS AS NEEDED FOR WHEEZE FOR UP TO 30 DAYS 04/15/21   [provider]  gabapentin (NEURONTIN) 300 MG capsule Take 1 capsule by mouth 3 (three) times daily. 06/06/22   [provider]  meloxicam (MOBIC) 15 MG tablet Take 15 mg by mouth daily.    [provider]  sertraline (ZOLOFT) 50 MG tablet Take 1 tablet by mouth daily. 12/22/21 06/06/23  [provider]    DATA REVIEWED    Imaging Studies  No interval imaging to review today.  IMPRESSION  [name] is a 47 y.o. 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  PLAN  Neil Vargas is a very pleasant 47 year old presenting today via  telephone visit to discuss his response to conservative treatment.  Unfortunately he has failed to have any significant improvement with physical therapy or a singular ESI.  We did discuss the potential for surgical intervention however he is still hoping to avoid this.  He has an upcoming appointment with Dr. Mariah Milling to discuss additional injections and would like to pursue this  further.  He would also like to complete physical therapy.  Should he fail to improve with these measures, we will get him scheduled to see a surgeon to discuss surgical options.  He will call us should he decide to pursue this.  Will otherwise see him going forward on an as-needed basis.  He expressed understanding and was in agreement with this plan.  No orders of the defined types were placed in this encounter.   DISPOSITION  Follow up: In person appointment in  PRN  Susanne Borders, PA Neurosurgery  TELEPHONE DOCUMENTATION   This visit was performed via telephone.  Patient location: home Provider location: office  I spent a total of 5 minutes non-face-to-face activities for this visit on the date of this encounter including review of current clinical condition and response to treatment.  The patient is aware of and accepts the limits of this telehealth visit.

## 2022-12-22 ENCOUNTER — Other Ambulatory Visit: Payer: Self-pay | Admitting: Physical Medicine & Rehabilitation

## 2022-12-22 DIAGNOSIS — M542 Cervicalgia: Secondary | ICD-10-CM

## 2023-01-09 ENCOUNTER — Ambulatory Visit
Admission: RE | Admit: 2023-01-09 | Discharge: 2023-01-09 | Disposition: A | Discharge status: Home / Self Care | Payer: BC Managed Care – PPO | Source: Ambulatory Visit | Attending: Physical Medicine & Rehabilitation | Admitting: Physical Medicine & Rehabilitation

## 2023-01-09 DIAGNOSIS — M542 Cervicalgia: Secondary | ICD-10-CM

## 2023-04-19 ENCOUNTER — Emergency Department

## 2023-04-19 ENCOUNTER — Other Ambulatory Visit: Payer: Self-pay

## 2023-04-19 ENCOUNTER — Emergency Department
Admission: EM | Admit: 2023-04-19 | Discharge: 2023-04-19 | Disposition: A | Discharge status: Home / Self Care | Attending: Emergency Medicine | Admitting: Emergency Medicine

## 2023-04-19 DIAGNOSIS — S61012A Laceration without foreign body of left thumb without damage to nail, initial encounter: Secondary | ICD-10-CM | POA: Diagnosis present

## 2023-04-19 DIAGNOSIS — W312XXA Contact with powered woodworking and forming machines, initial encounter: Secondary | ICD-10-CM | POA: Insufficient documentation

## 2023-04-19 DIAGNOSIS — Z23 Encounter for immunization: Secondary | ICD-10-CM | POA: Insufficient documentation

## 2023-04-19 DIAGNOSIS — Y92009 Unspecified place in unspecified non-institutional (private) residence as the place of occurrence of the external cause: Secondary | ICD-10-CM | POA: Diagnosis not present

## 2023-04-19 MED ORDER — CEPHALEXIN 500 MG PO CAPS: 500.0000 mg | ORAL_CAPSULE | Freq: Four times a day (QID) | ORAL | 0 refills | Status: AC

## 2023-04-19 MED ORDER — LIDOCAINE HCL (PF) 1 % IJ SOLN
5.0000 mL | Freq: Once | INTRAMUSCULAR | Status: AC
  Administered 2023-04-19: 5 mL
  Filled 2023-04-19: qty 5

## 2023-04-19 MED ORDER — TETANUS-DIPHTH-ACELL PERTUSSIS 5-2.5-18.5 LF-MCG/0.5 IM SUSY
0.5000 mL | PREFILLED_SYRINGE | Freq: Once | INTRAMUSCULAR | Status: AC
  Administered 2023-04-19: 0.5 mL via INTRAMUSCULAR
  Filled 2023-04-19: qty 0.5

## 2023-04-19 MED ORDER — LIDOCAINE-EPINEPHRINE-TETRACAINE (LET) TOPICAL GEL
3.0000 mL | Freq: Once | TOPICAL | Status: AC
  Administered 2023-04-19: 3 mL via TOPICAL
  Filled 2023-04-19: qty 3

## 2023-04-19 NOTE — ED Notes (Signed)
 See triage notes. Patient c/o injury to his left thumb secondary to a chop saw.

## 2023-04-19 NOTE — Discharge Instructions (Addendum)
 Your tetanus was updated.  Keep sutures dry for first 24 hrs. Keep suture site of clean & dry. Gently use soap & water after first 24 hrs. DO NOT USE alcohol, hydrogen peroxide etc, to clean skin. You may cover the incision with clean gauze & replace it after your daily shower for your comfort. If you have skin tapes (Steristrips) or skin glue (Dermabond) on your incision, leave them in place. They will fall off on their own like a scab in a few weeks.  You may trim any edges that curl up with clean scissors.   Take antibiotics as instructed.  Return to ED or follow-up with your PCP for suture removal in 7 to 10 days.  Monitor for signs of infection such as pus or discharge from the incision site, increased redness that is spreading up the arm and development of a fever.  Wear finger splint for comfort and protection.

## 2023-04-19 NOTE — ED Triage Notes (Signed)
 Pt to ed from home via POV for thumb injury from a saw at home. Pt is caox4, in no acute distress and ambulatory in triage, Pt has laceration to the top of the thumb. Bleeding controlled at this time.

## 2023-04-19 NOTE — ED Provider Notes (Signed)
 [facility] Emergency Department Provider Note     Event Date/Time   First MD Initiated Contact with Patient 04/19/23 1727     (approximate)   History   Laceration (L thumb)   HPI  Neil Vargas is a 48 y.o. male presents to the ED for evaluation of left thumb laceration.  Patient reports he was woodworking at home and the wood saw came down on his finger.  Patient reports mild numbness however full range of motion.  Unknown tetanus status.     Physical Exam   Triage Vital Signs: ED Triage Vitals  Encounter Vitals Group     BP 04/19/23 1702 132/82     Systolic BP Percentile --      Diastolic BP Percentile --      Pulse Rate 04/19/23 1702 73     Resp 04/19/23 1702 16     Temp 04/19/23 1702 98.2 F (36.8 C)     Temp Source 04/19/23 1702 Oral     SpO2 04/19/23 1702 100 %     Weight 04/19/23 1703 205 lb (93 kg)     Height 04/19/23 1703 6\' 2"  (1.88 m)     Head Circumference --      Peak Flow --      Pain Score 04/19/23 1703 4     Pain Loc --      Pain Education --      Exclude from Growth Chart --     Most recent vital signs: Vitals:   04/19/23 1702 04/19/23 1957  BP: 132/82 126/74  Pulse: 73 75  Resp: 16 17  Temp: 98.2 F (36.8 C) 98 F (36.7 C)  SpO2: 100% 99%    General Awake, no distress.  HEENT NCAT.  CV:  Good peripheral perfusion.  RESP:  Normal effort.  ABD:  No distention.  Other:  Left thumb reveals approximate 2cm linear laceration across fat pad.  No nail damage.  Neurovascular status intact all throughout.  Good capillary refill.   ED Results / Procedures / Treatments   Labs (all labs ordered are listed, but only abnormal results are displayed) Labs Reviewed - No data to display  RADIOLOGY  I personally viewed and evaluated these images as part of my medical decision making, as well as reviewing the written report by the radiologist.  ED Provider Interpretation: No foreign body or fracture  DG Finger  Thumb Left Result Date: 04/19/2023 CLINICAL DATA:  Thumb injury on saw EXAM: LEFT THUMB 2+V COMPARISON:  None Available. FINDINGS: Soft tissue laceration noted in the left thumb. No radiopaque foreign bodies. No fracture, subluxation or dislocation. IMPRESSION: No fracture or foreign body. Electronically Signed   By: Charlett Nose M.D.   On: 04/19/2023 17:32    PROCEDURES:  Critical Care performed: No  .Laceration Repair  Date/Time: 04/19/2023 11:55 PM  Performed by: Conrad [place], PA-C Authorized by: Conrad , PA-C   Consent:    Consent obtained:  Verbal   Consent given by:  Patient   Risks discussed:  Infection, pain and poor cosmetic result Anesthesia:    Anesthesia method:  Nerve block and topical application   Topical anesthetic:  LET   Block needle gauge:  24 G   Block anesthetic:  Lidocaine 1% w/o epi   Block injection procedure:  Anatomic landmarks identified   Block outcome:  Anesthesia achieved Laceration details:    Location:  Finger   Finger location:  L thumb  Length (cm):  2 Treatment:    Area cleansed with:  Povidone-iodine and saline   Amount of cleaning:  Standard   Irrigation solution:  Sterile saline Skin repair:    Repair method:  Sutures   Suture size:  5-0   Suture material:  Nylon   Suture technique:  Simple interrupted   Number of sutures:  7 Approximation:    Approximation:  Close Repair type:    Repair type:  Simple Post-procedure details:    Dressing:  Non-adherent dressing and splint for protection   Procedure completion:  Tolerated    MEDICATIONS ORDERED IN ED: Medications  lidocaine (PF) (XYLOCAINE) 1 % injection 5 mL (5 mLs Infiltration Given 04/19/23 1812)  lidocaine-EPINEPHrine-tetracaine (LET) topical gel (3 mLs Topical Given 04/19/23 1828)  Tdap (BOOSTRIX) injection 0.5 mL (0.5 mLs Intramuscular Given 04/19/23 1951)     IMPRESSION / MDM / ASSESSMENT AND PLAN / ED COURSE  I reviewed the triage vital signs and the  nursing notes.                               48 y.o. male presents to the emergency department for evaluation and treatment of acute laceration left thumb. See HPI for further details.   Differential diagnosis includes, but is not limited to laceration, foreign body retention, neurovascular injury.  Patient's presentation is most consistent with acute complicated illness / injury requiring diagnostic workup.  Patient is alert and oriented.  He is hemodynamically stable.  Physical exam findings are stated above.  Laceration repair successful.  Please see procedure note.  7 sutures placed.  Patient advised to return in 7 to 10 days for suture removal.  Tetanus updated in ED.  Short course antibiotic sent to pharmacy.  ED return precautions discussed.  Patient is stable condition for discharge home.   FINAL CLINICAL IMPRESSION(S) / ED DIAGNOSES   Final diagnoses:  Laceration of left thumb without foreign body without damage to nail, initial encounter     Rx / DC Orders   ED Discharge Orders          Ordered    cephALEXin (KEFLEX) 500 MG capsule  4 times daily        04/19/23 1938             Note:  This document was prepared using Dragon voice recognition software and may include unintentional dictation errors.    Romeo Apple, Linna Thebeau A, PA-C 04/19/23 2357    Jene Every, MD 04/27/23 [phone]

## 2023-06-19 ENCOUNTER — Ambulatory Visit
Admission: EM | Admit: 2023-06-19 | Discharge: 2023-06-19 | Disposition: A | Discharge status: Home / Self Care | Attending: Family Medicine | Admitting: Family Medicine

## 2023-06-19 ENCOUNTER — Encounter: Payer: Self-pay | Admitting: Emergency Medicine

## 2023-06-19 DIAGNOSIS — L03113 Cellulitis of right upper limb: Secondary | ICD-10-CM

## 2023-06-19 MED ORDER — DOXYCYCLINE HYCLATE 100 MG PO CAPS: 100.0000 mg | ORAL_CAPSULE | Freq: Two times a day (BID) | ORAL | 0 refills | Status: AC

## 2023-06-19 NOTE — ED Provider Notes (Signed)
 [facility]    CSN: [id number] Arrival date & time: 06/19/23  1550      History   Chief Complaint Chief Complaint  Patient presents with   Insect Bite    Right forearm    HPI Neil Vargas is a 48 y.o. male.   HPI  Neil Vargas presents for concern for possible bug bite that occurred about a week ago. He has redness with streaking which he noticed yesterday. The area is warm.  Itching has mostly resolved.  No fever or pain with movement of his arm.    Neil Vargas has otherwise been well and has no other concerns.    Past Medical History:  Diagnosis Date   Anxiety    Asthma     There are no active problems to display for this patient.   Past Surgical History:  Procedure Laterality Date   BACK SURGERY     INGUINAL HERNIA REPAIR Right        Home Medications    Prior to Admission medications   Medication Sig Start Date End Date Taking? Authorizing Provider  doxycycline  (VIBRAMYCIN ) 100 MG capsule Take 1 capsule (100 mg total) by mouth 2 (two) times daily. 06/19/23  Yes Arnice Vanepps, DO  etodolac (LODINE) 400 MG tablet Take 400 mg by mouth. 06/08/23 06/07/24 Yes [provider]  sertraline (ZOLOFT) 50 MG tablet Take 1 tablet by mouth daily. 12/22/21 06/19/23 Yes [provider]  traMADol (ULTRAM) 50 MG tablet Take 50 mg by mouth. 06/16/23 12/13/23 Yes [provider]  albuterol (VENTOLIN HFA) 108 (90 Base) MCG/ACT inhaler INHALE 1 TO 2 PUFFS EVERY 4 TO 6 HOURS AS NEEDED FOR WHEEZE FOR UP TO 30 DAYS 04/15/21   [provider]  gabapentin (NEURONTIN) 300 MG capsule Take 1 capsule by mouth 3 (three) times daily. 06/06/22   [provider]  meloxicam (MOBIC) 15 MG tablet Take 15 mg by mouth daily.    [provider]  predniSONE (DELTASONE) 20 MG tablet Take 20 mg by mouth daily.    [provider]    Family History History reviewed. No pertinent family history.  Social History Social History   Tobacco  Use   Smoking status: Former    Current packs/day: 0.00    Types: Cigarettes    Quit date: 08/04/2016    Years since quitting: 6.8   Smokeless tobacco: Former    Types: Snuff    Quit date: 08/03/2020  Vaping Use   Vaping status: Never Used  Substance Use Topics   Alcohol use: Yes    Alcohol/week: 2.0 standard drinks of alcohol    Types: 2 Cans of beer per week   Drug use: Yes    Types: Marijuana     Allergies   Silicone and Morphine   Review of Systems Review of Systems :negative unless otherwise stated in HPI.      Physical Exam Triage Vital Signs ED Triage Vitals  Encounter Vitals Group     BP 06/19/23 1629 (!) 139/91     Systolic BP Percentile --      Diastolic BP Percentile --      Pulse Rate 06/19/23 1629 93     Resp 06/19/23 1629 15     Temp 06/19/23 1629 98.8 F (37.1 C)     Temp Source 06/19/23 1629 Oral     SpO2 06/19/23 1629 96 %     Weight 06/19/23 1627 205 lb 0.4 oz (93 kg)  Height 06/19/23 1627 6\' 2"  (1.88 m)     Head Circumference --      Peak Flow --      Pain Score 06/19/23 1626 1     Pain Loc --      Pain Education --      Exclude from Growth Chart --    No data found.  Updated Vital Signs BP (!) 139/91 (BP Location: Left Arm)   Pulse 93   Temp 98.8 F (37.1 C) (Oral)   Resp 15   Ht 6\' 2"  (1.88 m)   Wt 93 kg   SpO2 96%   BMI 26.32 kg/m   Visual Acuity Right Eye Distance:   Left Eye Distance:   Bilateral Distance:    Right Eye Near:   Left Eye Near:    Bilateral Near:     Physical Exam  GEN: alert, well appearing male, in no acute distress  CV: regular rate, strong radial pulse RESP: no increased work of breathing MSK: no extremity edema, normal range of motion of elbow and wrist NEURO: alert, moves all extremities appropriately SKIN: warm and dry; 14 x 5 cm area of erythema with warmth right proximal forearm extending across the antecubital fossa     UC Treatments / Results  Labs (all labs ordered are listed, but  only abnormal results are displayed) Labs Reviewed - No data to display  EKG   Radiology No results found.  Procedures Procedures (including critical care time)  Medications Ordered in UC Medications - No data to display  Initial Impression / Assessment and Plan / UC Course  I have reviewed the triage vital signs and the nursing notes.  Pertinent labs & imaging results that were available during my care of the patient were reviewed by me and considered in my medical decision making (see chart for details).     Patient is a 48 y.o. malewho presents for red erythematous patch and skin lesion on right forearm.  Overall, patient is well-appearing and well-hydrated.  Vital signs stable.  Neil Vargas is afebrile.  Exam concerning for cellulitis.  Treat with antibiotics as below.  Area marked with skin marker prior to discharge. No sign of fungal infection to suggest antifungals at this time.  Not likely viral exanthem.    Reviewed expectations regarding course of current medical issues.  All questions asked were answered.  Outlined signs and symptoms indicating need for more acute intervention. Patient verbalized understanding. After Visit Summary given.   Final Clinical Impressions(s) / UC Diagnoses   Final diagnoses:  Cellulitis of arm, right     Discharge Instructions      Stop by the pharmacy to pick up your prescriptions.  Follow up with your primary care provider or return to the urgent care, if not improving.   Go to ED for red flag symptoms, including; fevers you cannot reduce with Tylenol/Motrin, severe headaches, vision changes, numbness/weakness in part of the body, lethargy, confusion, intractable vomiting, severe dehydration, chest pain, breathing difficulty, severe persistent abdominal or pelvic pain, signs of severe infection (increased redness, swelling of an area), feeling faint or passing out, dizziness, etc. You should especially go to the ED for sudden acute  worsening of condition if you do not elect to go at this time.     ED Prescriptions     Medication Sig Dispense Auth. Provider   doxycycline  (VIBRAMYCIN ) 100 MG capsule Take 1 capsule (100 mg total) by mouth 2 (two) times daily. 20 capsule Demario Faniel,  DO      PDMP not reviewed this encounter.              Mialee Weyman, DO 06/20/23 1547

## 2023-06-19 NOTE — ED Triage Notes (Signed)
 Patient reports possible insect bite to his right forearm about a week ago.  Patient reports redness, tenderness and red streak going up his right arm.  Patient denies fevers.

## 2023-06-19 NOTE — Discharge Instructions (Addendum)
 Stop by the pharmacy to pick up your prescriptions.  Follow up with your primary care provider or return to the urgent care, if not improving.   Go to ED for red flag symptoms, including; fevers you cannot reduce with Tylenol/Motrin, severe headaches, vision changes, numbness/weakness in part of the body, lethargy, confusion, intractable vomiting, severe dehydration, chest pain, breathing difficulty, severe persistent abdominal or pelvic pain, signs of severe infection (increased redness, swelling of an area), feeling faint or passing out, dizziness, etc. You should especially go to the ED for sudden acute worsening of condition if you do not elect to go at this time.
